# Patient Record
Sex: Male | Born: 2000 | Race: Black or African American | Hispanic: No | Marital: Single | Smoking: Never smoker
Health system: Southern US, Community
[De-identification: ages and names within clinical notes are randomized; demographics above are authoritative.]

## PROBLEM LIST (undated history)

## (undated) HISTORY — PX: NO PAST SURGERIES: SHX2092

---
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
## 2020-01-28 ENCOUNTER — Other Ambulatory Visit: Payer: No Typology Code available for payment source

## 2020-03-19 IMAGING — CR DG ANKLE COMPLETE 3+V*R*
4 series · 4 of 4 positions shown · non-contrast
Comparison: 07/16/2016

CLINICAL DATA: Right ankle pain while running today.

EXAM:
RIGHT ANKLE - COMPLETE 3+ VIEW

[ankle ap]
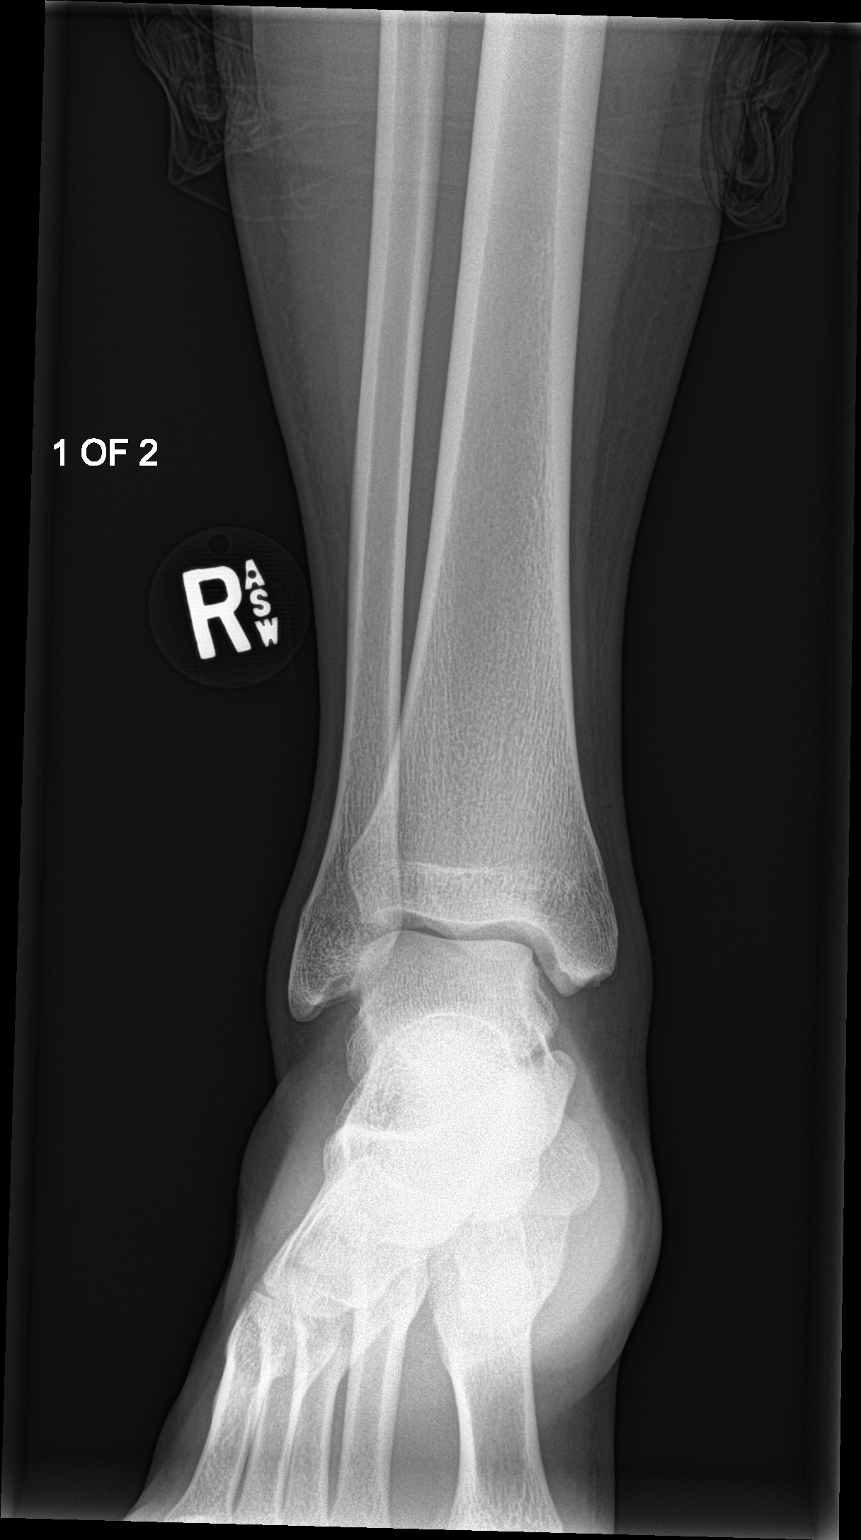

[ankle obl (1 of 2)]
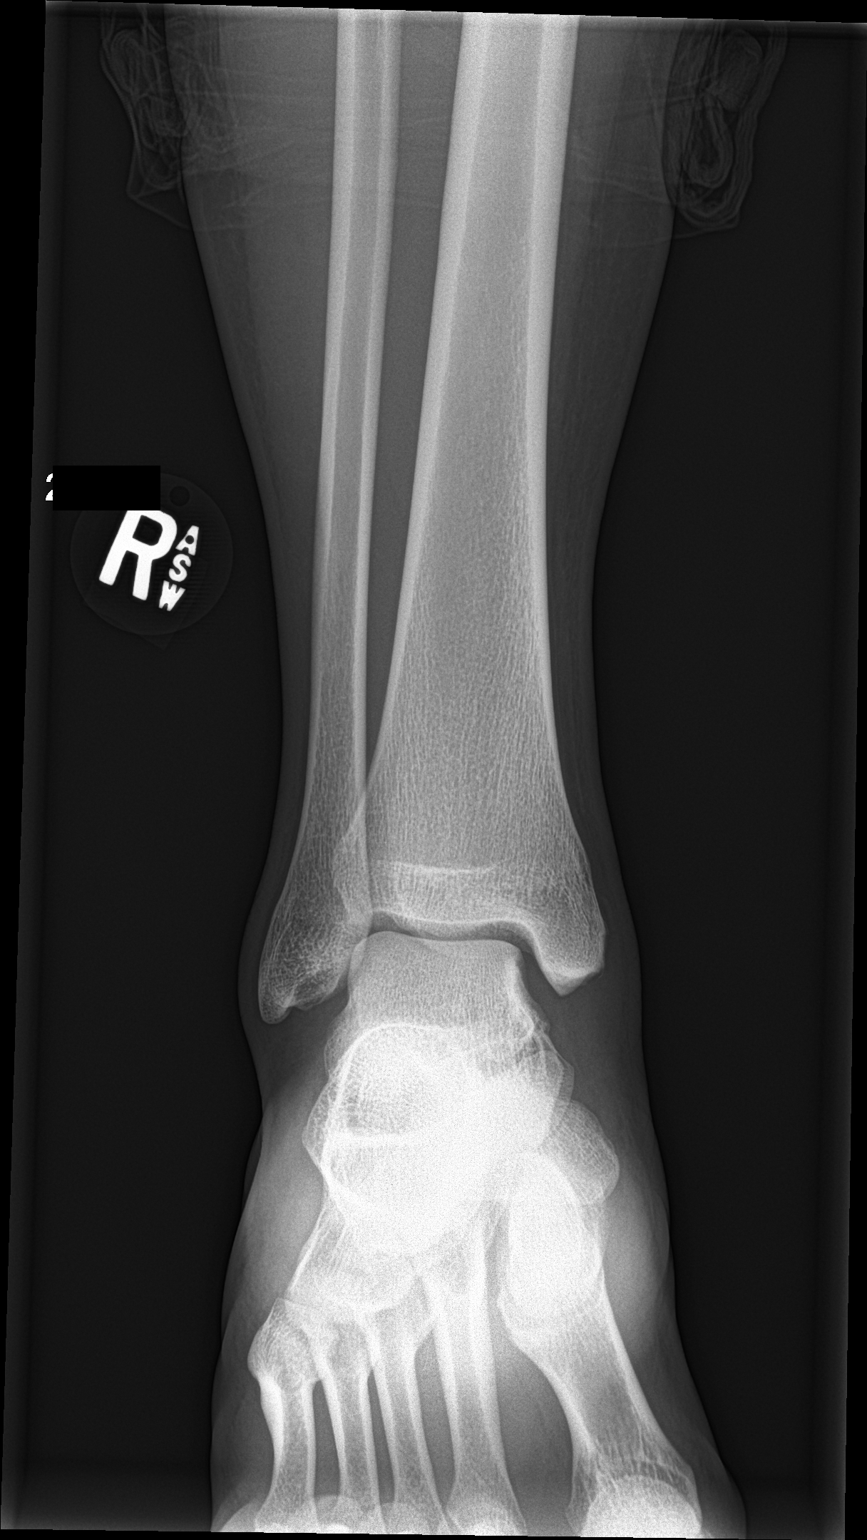

[ankle lat]
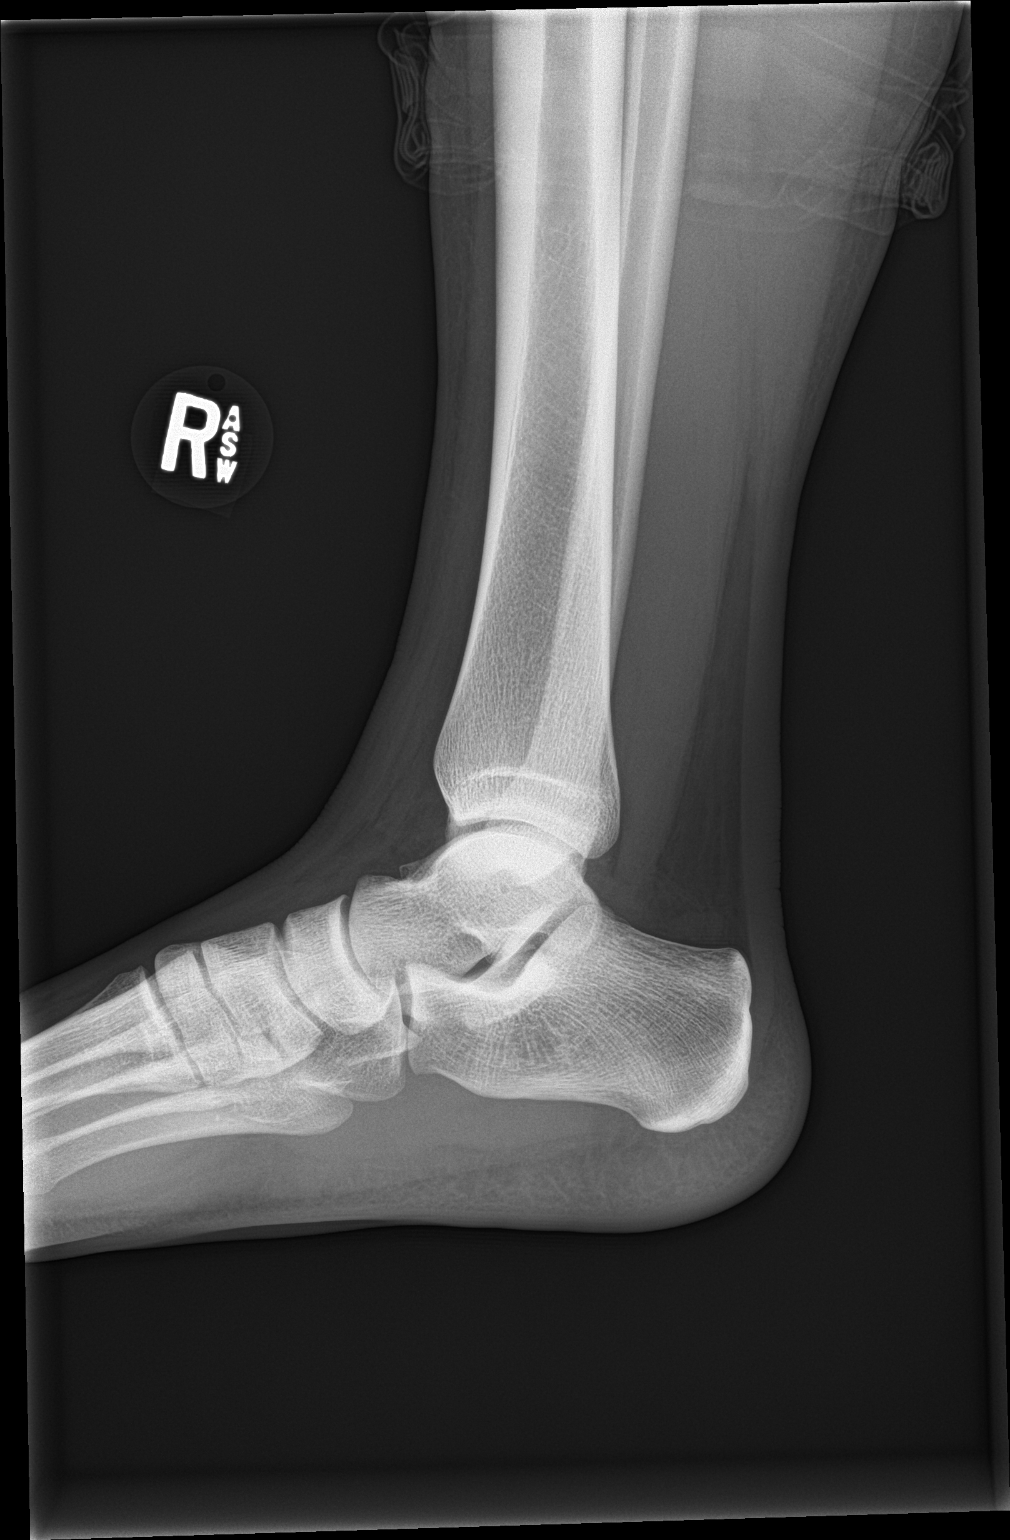

[ankle obl (2 of 2)]
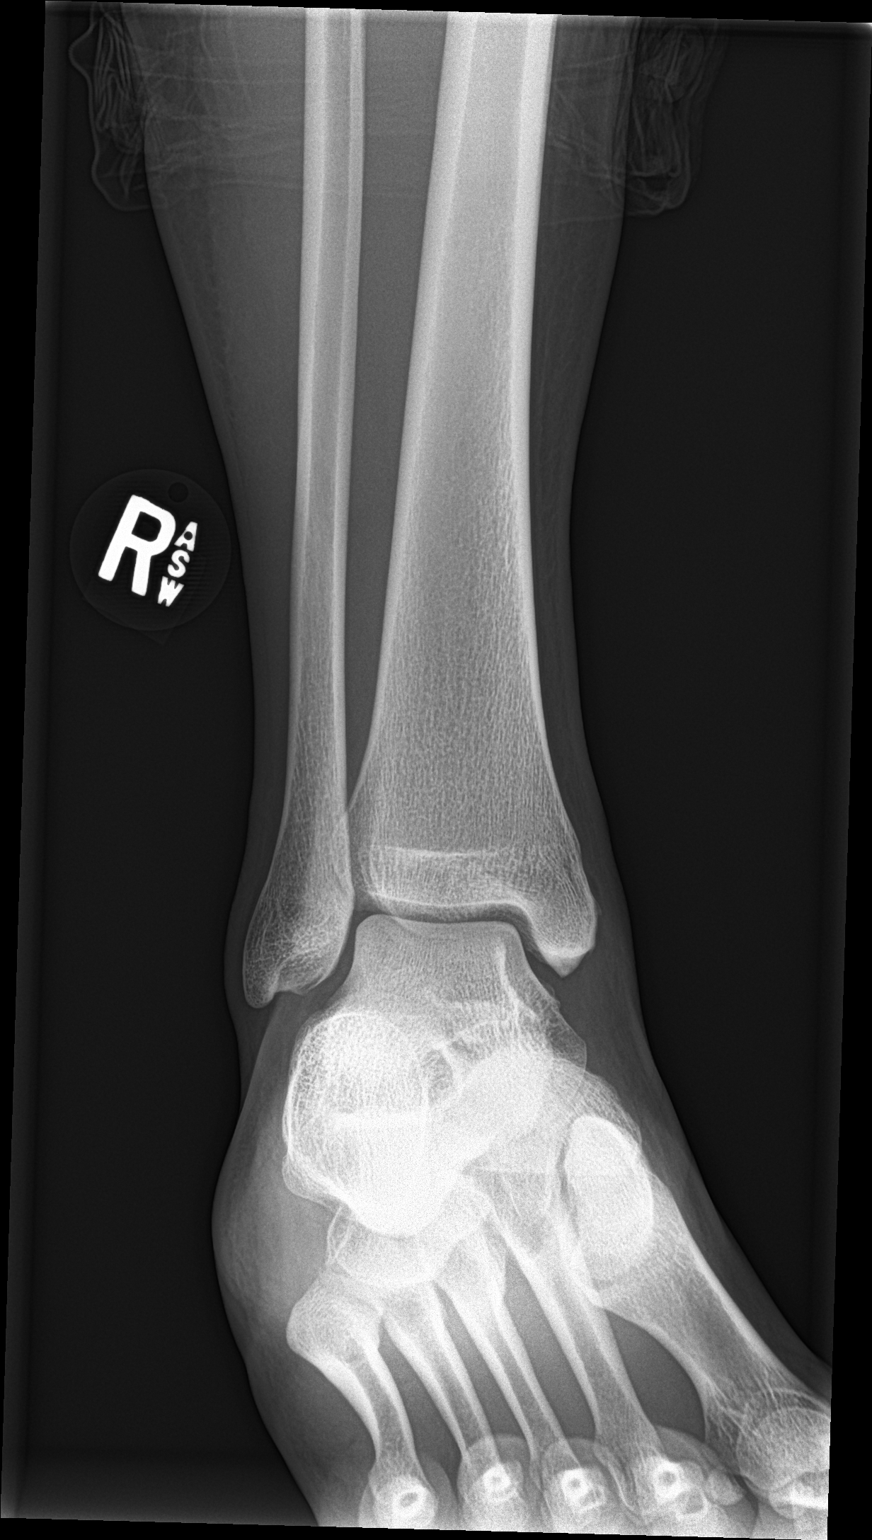

[4 of 4 positions shown; findings below may reference images not displayed]

FINDINGS: There is no evidence of fracture, dislocation, or joint effusion.
There is no evidence of arthropathy or other focal bone abnormality.
Soft tissues are unremarkable.
IMPRESSION: No acute fracture or malalignment. No significant soft tissue
swelling or joint effusion. No periosteal new bone formation to
suggest a stress fracture.
